# Patient Record
Sex: Female | Born: 1965 | Hispanic: No | Marital: Single | Smoking: Never smoker
Health system: Southern US, Community
[De-identification: ages and names within clinical notes are randomized; demographics above are authoritative.]

## PROBLEM LIST (undated history)

## (undated) DIAGNOSIS — E119 Type 2 diabetes mellitus without complications: Secondary | ICD-10-CM

## (undated) DIAGNOSIS — E079 Disorder of thyroid, unspecified: Secondary | ICD-10-CM

## (undated) HISTORY — PX: TUBAL LIGATION: SHX77

---

## 1997-09-12 ENCOUNTER — Ambulatory Visit (HOSPITAL_COMMUNITY): Admission: RE | Admit: 1997-09-12 | Discharge: 1997-09-12 | Payer: Self-pay | Admitting: Gynecology

## 1997-09-17 ENCOUNTER — Inpatient Hospital Stay (HOSPITAL_COMMUNITY): Admission: AD | Admit: 1997-09-17 | Discharge: 1997-09-17 | Payer: Self-pay | Admitting: Gynecology

## 1997-10-22 ENCOUNTER — Encounter (HOSPITAL_COMMUNITY): Admission: RE | Admit: 1997-10-22 | Discharge: 1998-01-20 | Payer: Self-pay | Admitting: Gynecology

## 1998-01-23 ENCOUNTER — Encounter (HOSPITAL_COMMUNITY): Admission: RE | Admit: 1998-01-23 | Discharge: 1998-04-23 | Payer: Self-pay | Admitting: Gynecology

## 1998-09-16 ENCOUNTER — Other Ambulatory Visit: Admission: RE | Admit: 1998-09-16 | Discharge: 1998-09-16 | Payer: Self-pay | Admitting: Gynecology

## 1999-11-25 ENCOUNTER — Other Ambulatory Visit: Admission: RE | Admit: 1999-11-25 | Discharge: 1999-11-25 | Payer: Self-pay | Admitting: Gynecology

## 2000-02-09 ENCOUNTER — Encounter (INDEPENDENT_AMBULATORY_CARE_PROVIDER_SITE_OTHER): Payer: Self-pay

## 2000-02-09 ENCOUNTER — Other Ambulatory Visit: Admission: RE | Admit: 2000-02-09 | Discharge: 2000-02-09 | Payer: Self-pay | Admitting: Internal Medicine

## 2000-03-15 ENCOUNTER — Encounter: Admission: RE | Admit: 2000-03-15 | Discharge: 2000-06-13 | Payer: Self-pay | Admitting: Gynecology

## 2000-05-26 ENCOUNTER — Inpatient Hospital Stay (HOSPITAL_COMMUNITY): Admission: AD | Admit: 2000-05-26 | Discharge: 2000-05-30 | Payer: Self-pay | Admitting: Gynecology

## 2000-05-26 ENCOUNTER — Encounter (INDEPENDENT_AMBULATORY_CARE_PROVIDER_SITE_OTHER): Payer: Self-pay

## 2000-05-31 ENCOUNTER — Encounter: Admission: RE | Admit: 2000-05-31 | Discharge: 2000-06-11 | Payer: Self-pay | Admitting: Gynecology

## 2000-07-08 ENCOUNTER — Other Ambulatory Visit: Admission: RE | Admit: 2000-07-08 | Discharge: 2000-07-08 | Payer: Self-pay | Admitting: *Deleted

## 2001-05-27 ENCOUNTER — Other Ambulatory Visit: Admission: RE | Admit: 2001-05-27 | Discharge: 2001-05-27 | Payer: Self-pay | Admitting: Gynecology

## 2003-01-08 ENCOUNTER — Emergency Department (HOSPITAL_COMMUNITY): Admission: EM | Admit: 2003-01-08 | Discharge: 2003-01-08 | Payer: Self-pay | Admitting: Emergency Medicine

## 2003-01-08 ENCOUNTER — Encounter: Payer: Self-pay | Admitting: Obstetrics & Gynecology

## 2003-01-16 ENCOUNTER — Encounter: Admission: RE | Admit: 2003-01-16 | Discharge: 2003-01-16 | Payer: Self-pay | Admitting: *Deleted

## 2003-01-26 ENCOUNTER — Ambulatory Visit (HOSPITAL_COMMUNITY): Admission: RE | Admit: 2003-01-26 | Discharge: 2003-01-26 | Payer: Self-pay | Admitting: *Deleted

## 2003-01-31 ENCOUNTER — Encounter: Admission: RE | Admit: 2003-01-31 | Discharge: 2003-01-31 | Payer: Self-pay | Admitting: *Deleted

## 2003-02-14 ENCOUNTER — Ambulatory Visit (HOSPITAL_COMMUNITY): Admission: RE | Admit: 2003-02-14 | Discharge: 2003-02-14 | Payer: Self-pay | Admitting: *Deleted

## 2003-02-21 ENCOUNTER — Encounter: Admission: RE | Admit: 2003-02-21 | Discharge: 2003-02-21 | Payer: Self-pay | Admitting: *Deleted

## 2003-03-07 ENCOUNTER — Encounter: Admission: RE | Admit: 2003-03-07 | Discharge: 2003-03-07 | Payer: Self-pay | Admitting: *Deleted

## 2003-03-14 ENCOUNTER — Encounter: Admission: RE | Admit: 2003-03-14 | Discharge: 2003-03-14 | Payer: Self-pay | Admitting: *Deleted

## 2003-03-21 ENCOUNTER — Encounter: Admission: RE | Admit: 2003-03-21 | Discharge: 2003-03-21 | Payer: Self-pay | Admitting: *Deleted

## 2003-03-21 ENCOUNTER — Ambulatory Visit (HOSPITAL_COMMUNITY): Admission: RE | Admit: 2003-03-21 | Discharge: 2003-03-21 | Payer: Self-pay | Admitting: *Deleted

## 2003-03-28 ENCOUNTER — Encounter: Admission: RE | Admit: 2003-03-28 | Discharge: 2003-03-28 | Payer: Self-pay | Admitting: *Deleted

## 2003-04-03 ENCOUNTER — Ambulatory Visit (HOSPITAL_COMMUNITY): Admission: RE | Admit: 2003-04-03 | Discharge: 2003-04-03 | Payer: Self-pay | Admitting: *Deleted

## 2003-04-11 ENCOUNTER — Encounter: Admission: RE | Admit: 2003-04-11 | Discharge: 2003-04-11 | Payer: Self-pay | Admitting: *Deleted

## 2003-04-13 ENCOUNTER — Encounter: Admission: RE | Admit: 2003-04-13 | Discharge: 2003-04-13 | Payer: Self-pay | Admitting: *Deleted

## 2003-04-17 ENCOUNTER — Encounter: Admission: RE | Admit: 2003-04-17 | Discharge: 2003-07-16 | Payer: Self-pay | Admitting: *Deleted

## 2003-04-18 ENCOUNTER — Encounter: Admission: RE | Admit: 2003-04-18 | Discharge: 2003-04-18 | Payer: Self-pay | Admitting: *Deleted

## 2003-04-25 ENCOUNTER — Encounter: Admission: RE | Admit: 2003-04-25 | Discharge: 2003-04-25 | Payer: Self-pay | Admitting: *Deleted

## 2003-05-02 ENCOUNTER — Encounter: Admission: RE | Admit: 2003-05-02 | Discharge: 2003-05-02 | Payer: Self-pay | Admitting: *Deleted

## 2003-05-10 ENCOUNTER — Encounter: Admission: RE | Admit: 2003-05-10 | Discharge: 2003-05-10 | Payer: Self-pay | Admitting: *Deleted

## 2003-05-16 ENCOUNTER — Encounter: Admission: RE | Admit: 2003-05-16 | Discharge: 2003-05-16 | Payer: Self-pay | Admitting: *Deleted

## 2003-05-24 ENCOUNTER — Ambulatory Visit (HOSPITAL_COMMUNITY): Admission: RE | Admit: 2003-05-24 | Discharge: 2003-05-24 | Payer: Self-pay | Admitting: *Deleted

## 2003-05-24 ENCOUNTER — Encounter: Admission: RE | Admit: 2003-05-24 | Discharge: 2003-05-24 | Payer: Self-pay | Admitting: *Deleted

## 2003-05-28 ENCOUNTER — Encounter: Admission: RE | Admit: 2003-05-28 | Discharge: 2003-05-28 | Payer: Self-pay | Admitting: *Deleted

## 2003-05-30 ENCOUNTER — Ambulatory Visit (HOSPITAL_COMMUNITY): Admission: RE | Admit: 2003-05-30 | Discharge: 2003-05-30 | Payer: Self-pay | Admitting: *Deleted

## 2003-05-30 ENCOUNTER — Encounter: Admission: RE | Admit: 2003-05-30 | Discharge: 2003-05-30 | Payer: Self-pay | Admitting: *Deleted

## 2003-06-04 ENCOUNTER — Encounter: Admission: RE | Admit: 2003-06-04 | Discharge: 2003-06-04 | Payer: Self-pay | Admitting: *Deleted

## 2003-06-07 ENCOUNTER — Encounter: Admission: RE | Admit: 2003-06-07 | Discharge: 2003-06-07 | Payer: Self-pay | Admitting: *Deleted

## 2003-06-12 ENCOUNTER — Encounter: Admission: RE | Admit: 2003-06-12 | Discharge: 2003-06-12 | Payer: Self-pay | Admitting: *Deleted

## 2003-06-14 ENCOUNTER — Encounter: Admission: RE | Admit: 2003-06-14 | Discharge: 2003-06-14 | Payer: Self-pay | Admitting: *Deleted

## 2003-06-18 ENCOUNTER — Encounter: Admission: RE | Admit: 2003-06-18 | Discharge: 2003-06-18 | Payer: Self-pay | Admitting: *Deleted

## 2003-06-19 ENCOUNTER — Inpatient Hospital Stay (HOSPITAL_COMMUNITY): Admission: AD | Admit: 2003-06-19 | Discharge: 2003-06-19 | Payer: Self-pay | Admitting: *Deleted

## 2003-06-20 ENCOUNTER — Encounter (INDEPENDENT_AMBULATORY_CARE_PROVIDER_SITE_OTHER): Payer: Self-pay | Admitting: Specialist

## 2003-06-20 ENCOUNTER — Inpatient Hospital Stay (HOSPITAL_COMMUNITY): Admission: AD | Admit: 2003-06-20 | Discharge: 2003-06-23 | Payer: Self-pay | Admitting: Obstetrics & Gynecology

## 2003-06-20 ENCOUNTER — Encounter: Admission: RE | Admit: 2003-06-20 | Discharge: 2003-06-20 | Payer: Self-pay | Admitting: *Deleted

## 2003-06-25 ENCOUNTER — Inpatient Hospital Stay (HOSPITAL_COMMUNITY): Admission: AD | Admit: 2003-06-25 | Discharge: 2003-06-25 | Payer: Self-pay | Admitting: Obstetrics and Gynecology

## 2003-06-27 ENCOUNTER — Encounter: Admission: RE | Admit: 2003-06-27 | Discharge: 2003-07-27 | Payer: Self-pay | Admitting: *Deleted

## 2003-07-04 ENCOUNTER — Inpatient Hospital Stay (HOSPITAL_COMMUNITY): Admission: AD | Admit: 2003-07-04 | Discharge: 2003-07-04 | Payer: Self-pay | Admitting: Obstetrics and Gynecology

## 2003-07-11 ENCOUNTER — Inpatient Hospital Stay (HOSPITAL_COMMUNITY): Admission: AD | Admit: 2003-07-11 | Discharge: 2003-07-11 | Payer: Self-pay | Admitting: Obstetrics and Gynecology

## 2010-11-07 NOTE — Discharge Summary (Signed)
[facility]  Patient:    Tara Burns, Tara Burns                          MRN: [id number] Adm. Date:  [id number] Disc. Date: [id number] Attending:  Tonye Royalty Dictator:   Antony Contras, [facility]                           Discharge Summary  DISCHARGE DIAGNOSES:          1. Intrauterine pregnancy at 37-6/7ths weeks.                               2. Maternal mixed collagen vascular disease.                               3. Maternal hypothyroidism.                               4. Intrauterine growth retardation.                               5. Fetal distress.                               6. Fetal Dandy-Walker variance.  PROCEDURES:                   Low cervical transverse cesarean section                               with delivery of viable infant.  HISTORY OF PRESENT ILLNESS:   Patient is a 45 year old gravida 2, para 1-0-01 with an LMP of September 04, 1999 and [facility] June 17, 2000. Prenatal risk factors include hypothyroidism, mixed connective tissue disease, history of endometriosis, history of postpartum depression in the past, history of CIN-1, and gestational diabetes, diet controlled.  LABORATORY DATA:              Blood type B positive, antibody screen negative. RPR, HBSAG, and HIV nonreactive. Rubella immune. MSAFP normal. GBS status is not on the chart. This patient had been followed closely in the office secondary to hypothyroidism, mixed connective tissue disorder, gestational diabetes, and potential variant of Dandy-Walker malformation. She had been receiving antenatal testing since 32 weeks with nonstress test and amniotic fluid indexes, which have been reactive and normal fluid. She was noted to have at 35 weeks, a mild lack of growth and an abnormal cystic structure in the posterior fossa of the foramen magnum. At that point, she was sent to [facility] for evaluation and was noted that she might have a potential variant of  Dandy-Walker but no evidence of hydrocephalus. Ultrasound scan on December 5 noted measurements which were about four weeks behind and an abdominal circumference about five and a half weeks behind. Amniotic fluid index was 11 cm. She was admitted for induction secondary to intrauterine growth restriction, possibly secondary to her mixed connective tissue disorder. Patient was currently on 150 mcg of Synthroid.  HOSPITAL COURSE/TREATMENT:    Patient was admitted for induction of labor and did develop  a persistent bradycardia. It was decided to proceed with cesarean section. This was performed by Dr. Lily Peer. Patient was delivered of a Apgar 9/9 female infant weighing 5 pounds 2 ounces. Cord pH was 7.23. The cord was compressed against the fetal head which was well engaged.  POSTPARTUM COURSE:            The patient remained afebrile, had no difficulty voiding, was able to be discharged on her third postoperative day in satisfactory condition. CBC: hematocrit 21.1, hemoglobin 7.6, WBCs 10.8, platelets were 91,000.  DISPOSITION:                  Follow up in the office in six weeks. Continue prenatal vitamins and iron. Motrin/Tylox for pain. DD:  07/05/00 TD:  07/05/00 Job: [id number]

## 2010-11-07 NOTE — Op Note (Signed)
[facility]  Patient:    Tara Burns, Tara Burns                          MRN: [id number] Adm. Date:  [id number] Attending:  Tonye Royalty                           Operative Report  SURGEON:                      Gaetano Hawthorne. Lily Peer, M.D.  INDICATIONS:                  The patient is a 46 year old gravida 2, para 1, at 48 and 6/[redacted] weeks gestation with recently diagnosed worsening IUGR. Also, patient herself with history of mixed collagen vascular disease. 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 The patient was brought in for induction today. The patient also with history of hypothyroidism on Synthroid. The patient was admitted on December 5 where she had Cervidil placed in her cervix. This morning, at 0900 hours, artificial rupture of membranes demonstrate clear amniotic fluid. Cervix was 2 cm dilated and about 60 to 70% effaced, and 0 station. She was started on Pitocin, and she progressed rapidly to approximately 9 cm dilated, but review of the fetal heart rate monitor strip demonstrated at 0930 hours, she begins to have variable decelerations to the 100 beat per minute range but returning back to a baseline with good variability. Then her contractions were noted to be spontaneous, every 2 to 4 minutes apart. She began to have more variable decelerations to 60 to 90 beat per minute range. At 11 oclock in the morning, 1100 hours, decelerations were deep and repetitive, 4 to 5 minutes, then finally persistent fetal bradycardia was noted in the 50 to 60 minute range despite lateral positioning and oxygen administration, and patient was taken for emergency cesarean section. The times were as follows:  The patient was in the operating room suite at 1120 hours. The incision was made at 1126 hours. The fetus was delivered at 1131 hours.  PREOPERATIVE DIAGNOSES:       1. Term intrauterine pregnancy at 69 and  6/[redacted]                                  weeks gestation.                               2. Maternal mixed collagen vascular disease.                               3. Maternal hypothyroidism.                               4. Intrauterine growth restriction.                               5. Fetal distress (persistent bradycardia).                               6. Fetal  Dandy-Walker variant.  POSTOPERATIVE DIAGNOSES       1. Term intrauterine pregnancy at 31 and 6/[redacted]                                  weeks gestation.                               2. Maternal mixed collagen vascular disease.                               3. Maternal hypothyroidism.                               4. Intrauterine growth restriction.                               5. Fetal distress (persistent bradycardia).                               6. Fetal Dandy-Walker variant.  ANESTHESIA:                   General endotracheal anesthesia.  PROCEDURE PERFORMED:          Emergency lower uterine segment transverse cesarean section.  FINDINGS:                     Viable female infant, Apgars at 4 and 9 with a weight of 5 pounds, 2 ounces. Arterial cord pH is 7.23. Umbilical cord was found to be compressed against a well-applied fetal head. Clear amniotic fluid, though minimal was noted and normal maternal pelvic anatomy. Small placenta.  DESCRIPTION OF OPERATION:     After the patient was adequately counseled as she was being taken emergently to the operating room. Once in the operating room, she was placed in the supine position after a difficult intubation. Foley catheter had been inserted. The abdomen was prepped and draped in the usual sterile fashion. Pfannenstiel skin incision was made 2 cm above the symphysis pubis, and the incision was carried down from the skin, subcutaneous tissue, down to the rectus fascia whereby a midline nick was made. The fascia was incised in a transverse fashion. The visceral peritoneum was  entered cautiously. The bladder flap was established. The lower uterine segment was incised in a transverse fashion. The head was well applied in the pelvis that required assistant to help dislodge the head from the lower uterine segment allowing the newborns head to be delivered without any complication. There was no nuchal cord, but the cord was noted to be compressed and against the fetus head which was well applied in the pelvis. Once the newborn was delivered, the nasopharyngeal area was bulb suctioned. The cord was doubly clamped and excised and then passed to the neonatologist who was present who gave the above-mentioned parameters. After cord blood was obtained, the placenta was delivered from the intrauterine cavity and submitted for histological evaluation. The uterus was then exteriorized. The intrauterine cavity was swept clear of any products of conception and the transverse lower uterine incision was closed with a running, locking stitch of 0 Vicryl suture. Inspection of the tubes and  ovaries appear normal. The uterus was placed back in the abdominal cavity. The pelvic cavity was copiously irrigated with normal saline solution. The patient received 2 g of Cefotan for prophylaxis. After ascertaining adequate hemostasis, the rectus fascia was closed with a running stitch of 0 Vicryl suture. The subcutaneous bleeders were Bovie cauterized. The skin was reapproximated with skin clips, ______  place Xeroform gauze and 4x4 dressing. The patient was extubated and transferred to the recovery room with stable vital signs. Blood loss from procedure was 800 cc of lactated Ringer. IV fluids were 1200 cc lactated Ringer. Urine output was 50 cc and bloody. DD:  05/27/00 TD:  05/27/00 Job: [id number]

## 2010-11-07 NOTE — Op Note (Signed)
Tara Burns, Tara Burns                         ACCOUNT NO.:  [id number]   MEDICAL RECORD NO.:  [id number]                   PATIENT TYPE:  MAT   LOCATION:  [facility]                                 FACILITY:  [facility]   PHYSICIAN:  Lesly Dukes, M.D.              DATE OF BIRTH:  [birth year]   DATE OF PROCEDURE:  06/20/2003  DATE OF DISCHARGE:                                 OPERATIVE REPORT   PREOPERATIVE DIAGNOSIS:  45 year old para 2-0-0-2 with decreased fetal  movement, lupus, gestational diabetes, desiring repeat cesarean section, and  bilateral tubal ligation.   POSTOPERATIVE DIAGNOSIS:  45 year old para 2-0-0-2 with decreased fetal  movement, lupus, gestational diabetes, desiring repeat cesarean section, and  bilateral tubal ligation.   PROCEDURE:  Repeat low flap transverse cesarean section, bilateral tubal  ligation via the Pomeroy method, lysis of adhesions.   SURGEON:  Lesly Dukes, M.D.   ANESTHESIA:  Spinal.   ESTIMATED BLOOD LOSS:  800 mL.   COMPLICATIONS:  None.   PATHOLOGY:  Placenta.   FINDINGS:  Viable female infant, Apgars 8 at 1, 9 at 5, weight 5 pounds 7  ounces, delivered from vertex presentation with the aid of a vacuum.  There  was also a small placenta noted with three vessel cord.  Clear fluid.  Peds  at delivery.  Normal uterus and fallopian tube.   PROCEDURE:  After informed consent was obtained, the patient was taken to  the operating room where spinal anesthesia was found to be adequate.  The  patient was prepared and draped in the normal sterile fashion with a Foley  catheter in the bladder.  A Pfannenstiel skin incision was made with the  scalpel and carried through to the underlying layer of fascia with the  Bovie.  The Bovie was used to incise the fascia in the midline and the  incision  was extended bilaterally with the Mayo scissors.  The superior and  inferior aspects of the fascial incision were grasped with the Kocher clamps  and the  rectus muscles were removed from the fascia.  The rectus muscles  were separated  in the midline.  The peritoneum was identified, tented up,  and entered sharply with Metzenbaum scissors.  This incision was extended  superiorly and inferiorly with good visualization of the bladder.  The  bladder blade was inserted.  There were a moderate to heavy amount of  adhesions involving the peritoneum and muscle, there was not adequate room  to deliver the baby's head, so a Maylard extension of the rectus muscle was  performed using the Bovie, good hemostasis was noted.  At this time, with  sufficient room determined available, the vesicouterine peritoneum was  identified, tented up, and entered sharply with Metzenbaum scissors.  This  incision was extended bilaterally and the bladder flap created digitally.  The bladder blade was reinserted.  The uterine incision was made in a  transverse  fashion in the lower uterine segment and the incision was  extended bilaterally with the Mayo scissors and with the bandage scissors.  The amniotic sac was ruptured and the baby's head delivered with the aid of  a vacuum.  The nose and mouth were suctioned.  The rest of the baby's body  was delivered easily.  The cord was clamped and cut and the baby was handed  off to the awaiting pediatricians.  Cord blood was sent for type and screen.   The uterus was exteriorized.  The placenta was delivered.  The uterus was  cleared of all clots and debris and closed with 0 Vicryl in a running locked  fashion.  A second suture of 0 Vicryl was used to reinforce the incision and  aid with hemostasis.  The tubal ligation was performed.  Each fallopian tube  was identified and followed out to its fimbriated end.  The [name] was used  to grasp the middle portion of the fallopian tube which was then doubly  ligated with 2-0 plain and transected.  Good hemostasis was noted from both  sides.  The uterus was returned to the abdomen  and the uterus was noted to  be hemostatic.  The peritoneum was then closed with 2-0 chromic in a running  fashion.  The rectus muscles were noted to be hemostatic one last time.  The  fascia was closed with 0 Vicryl in a running fashion, good hemostasis was  noted.  The subcutaneous tissues were copiously irrigated and found to be  hemostatic.  The skin was closed with staples.  The patient tolerated the  procedure well.  Sponge, lap, instrument, and needle counts were correct x  2.  The patient was taken to the recovery area in stable condition.                                               Lesly Dukes, M.D.    Lora Paula  D:  06/20/2003  T:  06/20/2003  Job:  [id number]

## 2010-11-07 NOTE — Discharge Summary (Signed)
NAMESEVEN, MARENGO                         ACCOUNT NO.:  [id number]   MEDICAL RECORD NO.:  [id number]                   PATIENT TYPE:  INP   LOCATION:  9305                                 FACILITY:  [facility]   PHYSICIAN:  Lesly Dukes, M.D.              DATE OF BIRTH:  28-Jan-1966   DATE OF ADMISSION:  06/20/2003  DATE OF DISCHARGE:  06/23/2003                                 DISCHARGE SUMMARY   DISCHARGE DIAGNOSES:  1. Delivery of a singleton birth status post repeat low transverse cesarean     section.  2. Bilateral tubal ligation.  3. Postoperative anemia.   DISCHARGE MEDICATIONS:  1. Ibuprofen 600 mg one p.o. q.6h. p.r.n. for pain.  2. Percocet 5/325 q.6h. p.r.n. for pain not relieved by the ibuprofen.  3. One multivitamin p.o. daily.  4. One iron tablet p.o. daily.   WOUND CARE:  Call if increased temperature, increased redness or drainage,  or any separation of tissue.   ACTIVITY:  Per booklet.   DIET:  Regular.   SEXUAL ACTIVITY:  Per booklet.   FOLLOW-UP CARE:  The patient is to return to [facility] and is to  call and make an appointment to be seen in 6 weeks.   DISPOSITION:  Discharged to home.   HOSPITAL COURSE:  The patient was a 45 year old now G3 P3-0-0-3 who  delivered a term female infant with Apgars of 8 and 9 by a repeat low  transverse C-section.  The patient was also a gestational diabetic and has a  history of lupus and thus had been followed in the high-risk clinic.  The  infant's weight was 5 pounds 7 ounces and she was noted to have a small  placenta, and thus it was sent for pathology.  The patient did well  postoperatively and at discharge her hemoglobin was noted to be 8.9.  She  was to continue her prenatal vitamin and iron tablet daily.  The patient was  discharged on postoperative day #3.  She was breast and bottle feeding at  that time and was rubella immune and Rh positive.  She did have a BTL status  post low transverse  C-section for birth control and her pain was well  controlled with Percocet and ibuprofen.  The patient, note, did have a small  to moderate drainage from her incision site and new Steri-Strips were  applied at the time of discharge.  The patient should follow up as an  outpatient to make sure that her gestational diabetes is resolved, and  should follow up if she has any complications with her wound.   Pathology report on the placenta:  It was a mature placenta with no gross  abnormalities.  Sections were submitted in __________.     Nani Gasser, M.D.                  Lesly Dukes, M.D.  CM/MEDQ  D:  09/10/2003  T:  09/11/2003  Job:  [id number]

## 2015-01-11 DIAGNOSIS — Z0289 Encounter for other administrative examinations: Secondary | ICD-10-CM

## 2015-02-04 ENCOUNTER — Encounter: Payer: Self-pay | Admitting: Family Medicine

## 2016-04-17 ENCOUNTER — Encounter (HOSPITAL_COMMUNITY): Payer: Self-pay

## 2016-04-17 ENCOUNTER — Emergency Department (HOSPITAL_COMMUNITY): Payer: No Typology Code available for payment source

## 2016-04-17 ENCOUNTER — Emergency Department (HOSPITAL_COMMUNITY)
Admission: EM | Admit: 2016-04-17 | Discharge: 2016-04-17 | Disposition: A | Discharge status: Home / Self Care | Payer: No Typology Code available for payment source | Attending: Emergency Medicine | Admitting: Emergency Medicine

## 2016-04-17 DIAGNOSIS — Y92410 Unspecified street and highway as the place of occurrence of the external cause: Secondary | ICD-10-CM | POA: Diagnosis not present

## 2016-04-17 DIAGNOSIS — Y939 Activity, unspecified: Secondary | ICD-10-CM | POA: Insufficient documentation

## 2016-04-17 DIAGNOSIS — S161XXA Strain of muscle, fascia and tendon at neck level, initial encounter: Secondary | ICD-10-CM

## 2016-04-17 DIAGNOSIS — E119 Type 2 diabetes mellitus without complications: Secondary | ICD-10-CM | POA: Insufficient documentation

## 2016-04-17 DIAGNOSIS — Z7984 Long term (current) use of oral hypoglycemic drugs: Secondary | ICD-10-CM | POA: Diagnosis not present

## 2016-04-17 DIAGNOSIS — Z794 Long term (current) use of insulin: Secondary | ICD-10-CM | POA: Diagnosis not present

## 2016-04-17 DIAGNOSIS — Y999 Unspecified external cause status: Secondary | ICD-10-CM | POA: Insufficient documentation

## 2016-04-17 DIAGNOSIS — S199XXA Unspecified injury of neck, initial encounter: Secondary | ICD-10-CM | POA: Diagnosis present

## 2016-04-17 HISTORY — DX: Disorder of thyroid, unspecified: E07.9

## 2016-04-17 HISTORY — DX: Type 2 diabetes mellitus without complications: E11.9

## 2016-04-17 MED ORDER — CYCLOBENZAPRINE HCL 10 MG PO TABS: 10.0000 mg | ORAL_TABLET | Freq: Two times a day (BID) | ORAL | 0 refills | Status: AC | PRN

## 2016-04-17 MED ORDER — NAPROXEN 500 MG PO TABS: 500.0000 mg | ORAL_TABLET | Freq: Two times a day (BID) | ORAL | 0 refills | Status: AC

## 2016-04-17 MED ORDER — TRAMADOL HCL 50 MG PO TABS
50.0000 mg | ORAL_TABLET | Freq: Once | ORAL | Status: AC
  Administered 2016-04-17: 50 mg via ORAL
  Filled 2016-04-17: qty 1
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
## 2016-04-17 NOTE — Discharge Instructions (Signed)
Your xrays are negative for any sign of broken bones. Apply ice to affected area. Increase your mobility. Take Naprosyn and Flexeril as needed for pain and inflammation. Follow up with your primary care provider if your symptoms do not improve. Return to the ED if you experience loss of consciousness, blurry vision, vomiting, loss of control of your bowel and bladder, numbness or tingling in your lower extremities. ° °
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
